# Patient Record
Sex: Female | Born: 1975 | Race: White | Hispanic: No | Smoking: Never smoker
Health system: Southern US, Community
[De-identification: ages and names within clinical notes are randomized; demographics above are authoritative.]

## PROBLEM LIST (undated history)

## (undated) DIAGNOSIS — F329 Major depressive disorder, single episode, unspecified: Secondary | ICD-10-CM

## (undated) DIAGNOSIS — F32A Depression, unspecified: Secondary | ICD-10-CM

## (undated) DIAGNOSIS — Z79890 Hormone replacement therapy (postmenopausal): Secondary | ICD-10-CM

## (undated) HISTORY — PX: CHOLECYSTECTOMY: SHX55

## (undated) HISTORY — PX: ABDOMINAL EXPLORATION SURGERY: SHX538

## (undated) HISTORY — PX: TOTAL ABDOMINAL HYSTERECTOMY W/ BILATERAL SALPINGOOPHORECTOMY: SHX83

---
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
## 2014-11-17 ENCOUNTER — Other Ambulatory Visit (HOSPITAL_COMMUNITY): Payer: Self-pay | Admitting: Otolaryngology

## 2014-11-17 DIAGNOSIS — R609 Edema, unspecified: Secondary | ICD-10-CM

## 2014-11-19 ENCOUNTER — Ambulatory Visit (HOSPITAL_COMMUNITY)
Admission: RE | Admit: 2014-11-19 | Discharge: 2014-11-19 | Disposition: A | Discharge status: Home / Self Care | Payer: 59 | Source: Ambulatory Visit | Attending: Otolaryngology | Admitting: Otolaryngology

## 2014-11-19 DIAGNOSIS — R609 Edema, unspecified: Secondary | ICD-10-CM

## 2014-11-19 DIAGNOSIS — R221 Localized swelling, mass and lump, neck: Secondary | ICD-10-CM | POA: Diagnosis not present

## 2014-11-19 MED ORDER — IOHEXOL 300 MG/ML  SOLN
75.0000 mL | Freq: Once | INTRAMUSCULAR | Status: AC | PRN
  Administered 2014-11-19: 75 mL via INTRAVENOUS

## 2019-01-16 ENCOUNTER — Emergency Department (HOSPITAL_COMMUNITY)
Admission: EM | Admit: 2019-01-16 | Discharge: 2019-01-16 | Disposition: A | Discharge status: Home / Self Care | Payer: 59 | Attending: Emergency Medicine | Admitting: Emergency Medicine

## 2019-01-16 ENCOUNTER — Emergency Department (HOSPITAL_COMMUNITY): Payer: 59

## 2019-01-16 ENCOUNTER — Other Ambulatory Visit: Payer: Self-pay

## 2019-01-16 DIAGNOSIS — N201 Calculus of ureter: Secondary | ICD-10-CM | POA: Diagnosis not present

## 2019-01-16 DIAGNOSIS — N132 Hydronephrosis with renal and ureteral calculous obstruction: Secondary | ICD-10-CM | POA: Diagnosis not present

## 2019-01-16 DIAGNOSIS — Z79899 Other long term (current) drug therapy: Secondary | ICD-10-CM | POA: Diagnosis not present

## 2019-01-16 DIAGNOSIS — R1031 Right lower quadrant pain: Secondary | ICD-10-CM | POA: Diagnosis present

## 2019-01-16 LAB — CBC WITH DIFFERENTIAL/PLATELET
Abs Immature Granulocytes: 0.02 10*3/uL (ref 0.00–0.07)
Basophils Absolute: 0.1 10*3/uL (ref 0.0–0.1)
Basophils Relative: 1 %
Eosinophils Absolute: 0.1 10*3/uL (ref 0.0–0.5)
Eosinophils Relative: 1 %
HCT: 37.1 % (ref 36.0–46.0)
Hemoglobin: 13.5 g/dL (ref 12.0–15.0)
Immature Granulocytes: 0 %
Lymphocytes Relative: 30 %
Lymphs Abs: 2.2 10*3/uL (ref 0.7–4.0)
MCH: 31.6 pg (ref 26.0–34.0)
MCHC: 36.4 g/dL — ABNORMAL HIGH (ref 30.0–36.0)
MCV: 86.9 fL (ref 80.0–100.0)
Monocytes Absolute: 0.5 10*3/uL (ref 0.1–1.0)
Monocytes Relative: 7 %
Neutro Abs: 4.4 10*3/uL (ref 1.7–7.7)
Neutrophils Relative %: 61 %
Platelets: 234 10*3/uL (ref 150–400)
RBC: 4.27 MIL/uL (ref 3.87–5.11)
RDW: 11.9 % (ref 11.5–15.5)
WBC: 7.2 10*3/uL (ref 4.0–10.5)
nRBC: 0 % (ref 0.0–0.2)

## 2019-01-16 LAB — COMPREHENSIVE METABOLIC PANEL
ALT: 19 U/L (ref 0–44)
AST: 31 U/L (ref 15–41)
Albumin: 4.6 g/dL (ref 3.5–5.0)
Alkaline Phosphatase: 47 U/L (ref 38–126)
Anion gap: 13 (ref 5–15)
BUN: 20 mg/dL (ref 6–20)
CO2: 19 mmol/L — ABNORMAL LOW (ref 22–32)
Calcium: 9.7 mg/dL (ref 8.9–10.3)
Chloride: 102 mmol/L (ref 98–111)
Creatinine, Ser: 0.91 mg/dL (ref 0.44–1.00)
GFR calc Af Amer: >60 mL/min (ref >60)
GFR calc non Af Amer: >60 mL/min (ref >60)
Glucose, Bld: 165 mg/dL — ABNORMAL HIGH (ref 70–99)
Potassium: 3.8 mmol/L (ref 3.5–5.1)
Sodium: 134 mmol/L — ABNORMAL LOW (ref 135–145)
Total Bilirubin: 1.1 mg/dL (ref 0.3–1.2)
Total Protein: 7.3 g/dL (ref 6.5–8.1)

## 2019-01-16 LAB — URINALYSIS, ROUTINE W REFLEX MICROSCOPIC
Bilirubin Urine: NEGATIVE
Glucose, UA: NEGATIVE mg/dL
Ketones, ur: NEGATIVE mg/dL
Leukocytes,Ua: NEGATIVE
Nitrite: NEGATIVE
Protein, ur: NEGATIVE mg/dL
Specific Gravity, Urine: 1.019 (ref 1.005–1.030)
pH: 5 (ref 5.0–8.0)

## 2019-01-16 LAB — LIPASE, BLOOD: Lipase: 27 U/L (ref 11–51)

## 2019-01-16 MED ORDER — HYDROMORPHONE HCL 1 MG/ML IJ SOLN
0.5000 mg | Freq: Once | INTRAMUSCULAR | Status: AC
  Administered 2019-01-16: 0.5 mg via INTRAMUSCULAR

## 2019-01-16 MED ORDER — HYDROCODONE-ACETAMINOPHEN 5-325 MG PO TABS: 2.0000 | ORAL_TABLET | ORAL | 0 refills | Status: DC | PRN

## 2019-01-16 MED ORDER — HYDROMORPHONE HCL 1 MG/ML IJ SOLN
0.5000 mg | Freq: Once | INTRAMUSCULAR | Status: DC
  Filled 2019-01-16: qty 1

## 2019-01-16 MED ORDER — KETOROLAC TROMETHAMINE 10 MG PO TABS: 10.0000 mg | ORAL_TABLET | Freq: Four times a day (QID) | ORAL | 0 refills | Status: AC | PRN

## 2019-01-16 MED ORDER — ONDANSETRON 4 MG PO TBDP
4.0000 mg | ORAL_TABLET | Freq: Once | ORAL | Status: AC
  Administered 2019-01-16: 4 mg via ORAL

## 2019-01-16 MED ORDER — ONDANSETRON 4 MG PO TBDP
ORAL_TABLET | ORAL | Status: AC
  Filled 2019-01-16: qty 1

## 2019-01-16 MED ORDER — KETOROLAC TROMETHAMINE 30 MG/ML IJ SOLN
30.0000 mg | Freq: Once | INTRAMUSCULAR | Status: AC
  Administered 2019-01-16: 30 mg via INTRAMUSCULAR
  Filled 2019-01-16: qty 1

## 2019-01-16 MED ORDER — ONDANSETRON 4 MG PO TBDP: 4.0000 mg | ORAL_TABLET | Freq: Three times a day (TID) | ORAL | 0 refills | Status: AC | PRN

## 2019-01-16 MED ORDER — ONDANSETRON HCL 4 MG/2ML IJ SOLN
4.0000 mg | Freq: Once | INTRAMUSCULAR | Status: DC
  Filled 2019-01-16: qty 2

## 2019-01-16 NOTE — ED Notes (Signed)
Attempts to gain IV access made by 2 RNs

## 2019-01-16 NOTE — Discharge Instructions (Addendum)
Take Tylenol and Toradol as needed as directed for pain. Take Zofran as needed as prescribed for nausea and vomiting. Take Norco for pain not controlled with Tylenol or Toradol.  Follow-up with urology if pain persists on Monday.  Return to ER for any new or worsening symptoms especially fever, vomiting, pain not controlled with your medications.

## 2019-01-16 NOTE — ED Triage Notes (Signed)
C/o R flank pain; saw PCP Monday for dysuria, given Cipro; increase in urinary frequency today; also endorses nausea

## 2019-01-16 NOTE — ED Provider Notes (Signed)
[facility] Provider [facility]   CSN: [id number] Arrival date & time: 01/16/19  1804     History   Chief Complaint No chief complaint on file.   HPI Carolynn CommentLisa Carroll is a 43 y.o. female.     43 year old female presents with complaint of right flank pain.  Patient states she was seen by her PCP on Monday for urinary symptoms and given Cipro for urinary tract infection.  Patient continues to have urinary frequency, worse today.  Patient has intermittent right flank pain today but then suddenly worsening right flank pain this evening associated with nausea, no vomiting.  No changes in bowel habits, no fevers or chills.  Patient has history of kidney stone previously but states it has been several years.  Prior abdominal surgeries include hysterectomy.  No other complaints or concerns.     No past medical history on file.  There are no active problems to display for this patient.    OB History   No obstetric history on file.      Home Medications    Prior to Admission medications   Medication Sig Start Date End Date Taking? Authorizing Provider  diazepam (VALIUM) 5 MG tablet Take 5 mg by mouth as needed for dizziness.   Yes [provider]  estradiol (ESTRACE) 1 MG tablet Take 1 mg by mouth daily. 10/11/17  Yes [provider]  ondansetron (ZOFRAN) 4 MG tablet Take 4 mg by mouth every 8 (eight) hours as needed for nausea/vomiting.   Yes [provider]  promethazine (PHENERGAN) 25 MG tablet Take 25 mg by mouth every 6 (six) hours as needed for nausea.   Yes [provider]  scopolamine (TRANSDERM-SCOP) 1 MG/3DAYS Place 1 patch onto the skin as needed for dizziness.   Yes [provider]  sertraline (ZOLOFT) 50 MG tablet Take 50 mg by mouth at bedtime.   Yes [provider]  HYDROcodone-acetaminophen (NORCO/VICODIN) 5-325 MG tablet Take 2 tablets by mouth every 4 (four) hours as needed. 01/16/19    Jeannie FendMurphy, Mavrick Mcquigg A, PA-C  ketorolac (TORADOL) 10 MG tablet Take 1 tablet (10 mg total) by mouth every 6 (six) hours as needed. 01/16/19   Jeannie FendMurphy, Dawaun Brancato A, PA-C  ondansetron (ZOFRAN ODT) 4 MG disintegrating tablet Take 1 tablet (4 mg total) by mouth every 8 (eight) hours as needed for nausea or vomiting. 01/16/19   Jeannie FendMurphy, Taryne Kiger A, PA-C    Family History No family history on file.  Social History Social History   Tobacco Use   Smoking status: Not on file  Substance Use Topics   Alcohol use: Not on file   Drug use: Not on file     Allergies   Patient has no known allergies.   Review of Systems Review of Systems  Constitutional: Negative for chills, diaphoresis and fever.  Respiratory: Negative for shortness of breath.   Cardiovascular: Negative for chest pain.  Gastrointestinal: Positive for nausea. Negative for abdominal pain, constipation, diarrhea and vomiting.  Genitourinary: Positive for flank pain, frequency and urgency. Negative for dysuria and hematuria.  Musculoskeletal: Positive for back pain.  Skin: Negative for rash and wound.  Allergic/Immunologic: Negative for immunocompromised state.  Neurological: Negative for dizziness and weakness.  Psychiatric/Behavioral: Negative for confusion.  All other systems reviewed and are negative.    Physical Exam Updated Vital Signs BP 135/89 (BP Location: Right Arm)    Pulse 77    Temp 98.3 F (36.8 C) (Oral)  Resp 19    Ht 5\' 2"  (1.575 m)    Wt 54.4 kg    SpO2 99%    BMI 21.95 kg/m   Physical Exam Vitals signs and nursing note reviewed.  Constitutional:      General: She is not in acute distress.    Appearance: She is well-developed. She is not diaphoretic.     Comments: Tearful, appears to be in pain  HENT:     Head: Normocephalic and atraumatic.  Cardiovascular:     Rate and Rhythm: Normal rate and regular rhythm.     Pulses: Normal pulses.     Heart sounds: Normal heart sounds.  Pulmonary:     Effort:  Pulmonary effort is normal.     Breath sounds: Normal breath sounds.  Abdominal:     Palpations: Abdomen is soft.     Tenderness: There is abdominal tenderness. There is right CVA tenderness. There is no left CVA tenderness.     Comments: Mild right side abdominal tenderness  Musculoskeletal:     Lumbar back: She exhibits no bony tenderness.       Back:  Skin:    General: Skin is warm and dry.     Findings: No erythema or rash.  Neurological:     Mental Status: She is alert and oriented to person, place, and time.  Psychiatric:        Behavior: Behavior normal.      ED Treatments / Results  Labs (all labs ordered are listed, but only abnormal results are displayed) Labs Reviewed  CBC WITH DIFFERENTIAL/PLATELET - Abnormal; Notable for the following components:      Result Value   MCHC 36.4 (*)    All other components within normal limits  COMPREHENSIVE METABOLIC PANEL - Abnormal; Notable for the following components:   Sodium 134 (*)    CO2 19 (*)    Glucose, Bld 165 (*)    All other components within normal limits  URINALYSIS, ROUTINE W REFLEX MICROSCOPIC - Abnormal; Notable for the following components:   Hgb urine dipstick SMALL (*)    All other components within normal limits  LIPASE, BLOOD    EKG None  Radiology Ct Renal Stone Study  Result Date: 01/16/2019 CLINICAL DATA:  Right flank pain. Nausea. Dysuria. EXAM: CT ABDOMEN AND PELVIS WITHOUT CONTRAST TECHNIQUE: Multidetector CT imaging of the abdomen and pelvis was performed following the standard protocol without IV contrast. COMPARISON:  None. FINDINGS: Lower chest: Lung bases are clear. Hepatobiliary: Unremarkable noncontrast appearance of the liver. Gallbladder is not well visualized may be completely decompressed or surgically absent. There is no biliary dilatation. Pancreas: No ductal dilatation or inflammation. Spleen: Normal in size without focal abnormality. Adrenals/Urinary Tract: Normal adrenal glands. 2  mm obstructing stone in the distal left ureter just proximal to the ureterovesicular junction with mild hydroureteronephrosis. Trace right perinephric edema. No additional nonobstructing stones in the right kidney. No left hydronephrosis or perinephric edema. No left renal stones. Left ureter is decompressed without stone along the course. Urinary bladder completely empty and not well evaluated. Stomach/Bowel: Stomach is within normal limits. Appendix appears normal, series 6, image 27. No evidence of bowel wall thickening, distention, or inflammatory changes. Minimal distal colonic diverticulosis without diverticulitis. Vascular/Lymphatic: Normal caliber abdominal aorta. No bulky abdominopelvic adenopathy. Reproductive: Uterus not visualized, presumably surgically absent. No adnexal mass. Other: No free air, free fluid, or intra-abdominal fluid collection. Musculoskeletal: There are no acute or suspicious osseous abnormalities. Hemi transitional lumbosacral anatomy. IMPRESSION:  1. Obstructing 2 mm stone in the distal left ureter with mild hydronephrosis. 2. Minimal distal colonic diverticulosis without diverticulitis. Electronically Signed   By: Keith Rake M.D.   On: 01/16/2019 19:13    Procedures Procedures (including critical care time)  Medications Ordered in ED Medications  ondansetron (ZOFRAN-ODT) disintegrating tablet 4 mg ( Oral Not Given 01/16/19 1914)  HYDROmorphone (DILAUDID) injection 0.5 mg (0.5 mg Intramuscular Given 01/16/19 1836)  ketorolac (TORADOL) 30 MG/ML injection 30 mg (30 mg Intramuscular Given 01/16/19 1935)     Initial Impression / Assessment and Plan / ED Course  I have reviewed the triage vital signs and the nursing notes.  Pertinent labs & imaging results that were available during my care of the patient were reviewed by me and considered in my medical decision making (see chart for details).  Clinical Course as of Jan 15 2250  Fri Jan 16, 4020  4280 43 year old  female with history of right flank pain, mild pain throughout the day today and then suddenly worsening pain.  Patient was recently seen by her PCP and diagnosed urinary tract infection started on Cipro.  Remote history of kidney stone, nothing recently.  On exam patient is very uncomfortable, rocking in the bed and tearful.  She has right low back tenderness with right CVA tenderness.  IV attempts unsuccessful, pain medications are switched to IM.  Lab work with CMP and CBC without significant findings, notable for normal renal function, urinalysis with small hemoglobin, no urinary tract infection.  Lipase within normal limits.  CT shows an obstructing 2 mm stone at the distal left ureter with mild hydronephrosis and minimal distal colonic diverticulosis without diverticulitis.  Also noted in the body of patient's CT she has trace right perinephric edema no additional obstructing stones in the right kidney.   [LM]  2250 Pain is better controlled with Toradol and patient is ready for discharge home.  She will be discharged with prescriptions for Toradol, Zofran, Norco with plans to follow-up with urology if not improving by Monday, advised to return to ER for any worsening pain vomiting fevers or any other concerning symptoms.   [LM]    Clinical Course User Index [LM] Tacy Learn, PA-C      Final Clinical Impressions(s) / ED Diagnoses   Final diagnoses:  Ureterolithiasis    ED Discharge Orders         Ordered    ondansetron (ZOFRAN ODT) 4 MG disintegrating tablet  Every 8 hours PRN     01/16/19 2117    HYDROcodone-acetaminophen (NORCO/VICODIN) 5-325 MG tablet  Every 4 hours PRN     01/16/19 2117    ketorolac (TORADOL) 10 MG tablet  Every 6 hours PRN     01/16/19 2117           Tacy Learn, PA-C 01/16/19 2251    Lucrezia Starch, MD 01/17/19 [phone]

## 2019-01-19 DIAGNOSIS — N201 Calculus of ureter: Secondary | ICD-10-CM | POA: Diagnosis not present

## 2019-04-10 ENCOUNTER — Encounter (HOSPITAL_COMMUNITY): Payer: Self-pay | Admitting: Emergency Medicine

## 2019-04-10 ENCOUNTER — Emergency Department (HOSPITAL_COMMUNITY)
Admission: EM | Admit: 2019-04-10 | Discharge: 2019-04-10 | Disposition: A | Discharge status: Home / Self Care | Payer: 59 | Attending: Emergency Medicine | Admitting: Emergency Medicine

## 2019-04-10 ENCOUNTER — Emergency Department (HOSPITAL_COMMUNITY): Payer: 59

## 2019-04-10 ENCOUNTER — Other Ambulatory Visit: Payer: Self-pay

## 2019-04-10 DIAGNOSIS — R000 Tachycardia, unspecified: Secondary | ICD-10-CM | POA: Diagnosis not present

## 2019-04-10 DIAGNOSIS — R0602 Shortness of breath: Secondary | ICD-10-CM | POA: Diagnosis not present

## 2019-04-10 DIAGNOSIS — M546 Pain in thoracic spine: Secondary | ICD-10-CM | POA: Diagnosis present

## 2019-04-10 DIAGNOSIS — R079 Chest pain, unspecified: Secondary | ICD-10-CM | POA: Diagnosis not present

## 2019-04-10 DIAGNOSIS — R091 Pleurisy: Secondary | ICD-10-CM | POA: Diagnosis not present

## 2019-04-10 DIAGNOSIS — Z79899 Other long term (current) drug therapy: Secondary | ICD-10-CM | POA: Diagnosis not present

## 2019-04-10 HISTORY — DX: Hormone replacement therapy: Z79.890

## 2019-04-10 HISTORY — DX: Depression, unspecified: F32.A

## 2019-04-10 LAB — CBC WITH DIFFERENTIAL/PLATELET
Abs Immature Granulocytes: 0.03 10*3/uL (ref 0.00–0.07)
Basophils Absolute: 0.1 10*3/uL (ref 0.0–0.1)
Basophils Relative: 1 %
Eosinophils Absolute: 0.1 10*3/uL (ref 0.0–0.5)
Eosinophils Relative: 2 %
HCT: 41.4 % (ref 36.0–46.0)
Hemoglobin: 14.3 g/dL (ref 12.0–15.0)
Immature Granulocytes: 0 %
Lymphocytes Relative: 24 %
Lymphs Abs: 1.7 10*3/uL (ref 0.7–4.0)
MCH: 31.4 pg (ref 26.0–34.0)
MCHC: 34.5 g/dL (ref 30.0–36.0)
MCV: 91 fL (ref 80.0–100.0)
Monocytes Absolute: 0.4 10*3/uL (ref 0.1–1.0)
Monocytes Relative: 6 %
Neutro Abs: 4.7 10*3/uL (ref 1.7–7.7)
Neutrophils Relative %: 67 %
Platelets: 222 10*3/uL (ref 150–400)
RBC: 4.55 MIL/uL (ref 3.87–5.11)
RDW: 11.8 % (ref 11.5–15.5)
WBC: 7 10*3/uL (ref 4.0–10.5)
nRBC: 0 % (ref 0.0–0.2)

## 2019-04-10 LAB — COMPREHENSIVE METABOLIC PANEL
ALT: 22 U/L (ref 0–44)
AST: 22 U/L (ref 15–41)
Albumin: 4.8 g/dL (ref 3.5–5.0)
Alkaline Phosphatase: 62 U/L (ref 38–126)
Anion gap: 8 (ref 5–15)
BUN: 21 mg/dL — ABNORMAL HIGH (ref 6–20)
CO2: 23 mmol/L (ref 22–32)
Calcium: 9.3 mg/dL (ref 8.9–10.3)
Chloride: 108 mmol/L (ref 98–111)
Creatinine, Ser: 0.71 mg/dL (ref 0.44–1.00)
GFR calc Af Amer: >60 mL/min (ref >60)
GFR calc non Af Amer: >60 mL/min (ref >60)
Glucose, Bld: 90 mg/dL (ref 70–99)
Potassium: 3.5 mmol/L (ref 3.5–5.1)
Sodium: 139 mmol/L (ref 135–145)
Total Bilirubin: 0.8 mg/dL (ref 0.3–1.2)
Total Protein: 8.2 g/dL — ABNORMAL HIGH (ref 6.5–8.1)

## 2019-04-10 LAB — D-DIMER, QUANTITATIVE: D-Dimer, Quant: <0.27 ug/mL-FEU (ref 0.00–0.50)

## 2019-04-10 LAB — TROPONIN I (HIGH SENSITIVITY): Troponin I (High Sensitivity): <2 ng/L (ref <18)

## 2019-04-10 MED ORDER — PREDNISONE 50 MG PO TABS
60.0000 mg | ORAL_TABLET | Freq: Once | ORAL | Status: AC
  Administered 2019-04-10: 60 mg via ORAL
  Filled 2019-04-10: qty 1

## 2019-04-10 MED ORDER — IOHEXOL 350 MG/ML SOLN
100.0000 mL | Freq: Once | INTRAVENOUS | Status: AC | PRN
  Administered 2019-04-10: 100 mL via INTRAVENOUS

## 2019-04-10 MED ORDER — BENZONATATE 100 MG PO CAPS: 200.0000 mg | ORAL_CAPSULE | Freq: Three times a day (TID) | ORAL | 0 refills | Status: AC | PRN

## 2019-04-10 MED ORDER — HYDROCODONE-ACETAMINOPHEN 5-325 MG PO TABS: 1.0000 | ORAL_TABLET | ORAL | 0 refills | Status: AC | PRN

## 2019-04-10 MED ORDER — HYDROCODONE-ACETAMINOPHEN 5-325 MG PO TABS
1.0000 | ORAL_TABLET | Freq: Once | ORAL | Status: AC
  Administered 2019-04-10: 1 via ORAL
  Filled 2019-04-10: qty 1

## 2019-04-10 MED ORDER — PREDNISONE 10 MG PO TABS: ORAL_TABLET | ORAL | 0 refills | Status: AC

## 2019-04-10 NOTE — ED Provider Notes (Signed)
[facility]ERGENC[facility] DEPART[facility] Provider Note   CSN: [id number] Arrival date & time: 04/10/19  1652     History   Chief Complaint Chief Complaint  Patient presents with  . Chest Pain    HPI Nichole Carroll is a 43 y.o. female with no significant past medical history presenting with a one week history of slowly worsening right upper back pain which now radiates into her right anterior upper chest and is worsened with deep inspiration.  Her pain is not triggered by movement, twisting or stretching.  She has had no fevers or chills. She does endorse occasional non productive cough but denies sob and has had no fevers or chills, also no n/v, abdominal pain.  She does endorse fatigue this week, has had no palpitations, dizziness, also no extremity pain or swelling.  She is on hormone replacement therapy, no other risk factors for dvt/PE.  She works in [employer], broadcasting/film/videonursing and has has potential exposure to [employer].      The history is provided by the patient.    Past Medical History:  Diagnosis Date  . Depression   . Hormone replacement therapy     There are no active problems to display for this patient.   Past Surgical History:  Procedure Laterality Date  . ABDOMINAL EXPLORATION SURGERY    . CESAREAN SECTION    . CHOLECYSTECTOMY    . TOTAL ABDOMINAL HYSTERECTOMY W/ BILATERAL SALPINGOOPHORECTOMY       OB History    Gravida      Para      Term      Preterm      AB      Living  2     SAB      TAB      Ectopic      Multiple      Live Births               Home Medications    Prior to Admission medications   Medication Sig Start Date End Date Taking? Authorizing Provider  diazepam (VALIUM) 5 MG tablet Take 5 mg by mouth as needed for dizziness.   Yes [provider]  estradiol (ESTRACE) 1 MG tablet Take 1 mg by mouth daily. 10/11/17  Yes [provider]  ondansetron (ZOFRAN ODT) 4 MG disintegrating tablet Take 1 tablet (4 mg total) by mouth every 8  (eight) hours as needed for nausea or vomiting. 01/16/19  Yes Jeannie FendMurphy, Laura A, PA-C  scopolamine (TRANSDERM-SCOP) 1 MG/3DAYS Place 1 patch onto the skin as needed for dizziness.   Yes [provider]  sertraline (ZOLOFT) 50 MG tablet Take 50 mg by mouth at bedtime.   Yes [provider]  benzonatate (TESSALON) 100 MG capsule Take 2 capsules (200 mg total) by mouth 3 (three) times daily as needed. 04/10/19   Burgess AmorIdol, Rya Rausch, PA-C  HYDROcodone-acetaminophen (NORCO/VICODIN) 5-325 MG tablet Take 1 tablet by mouth every 4 (four) hours as needed. 04/10/19   Burgess AmorIdol, Winnie Umali, PA-C  ketorolac (TORADOL) 10 MG tablet Take 1 tablet (10 mg total) by mouth every 6 (six) hours as needed. 01/16/19   Jeannie FendMurphy, Laura A, PA-C  predniSONE (DELTASONE) 10 MG tablet Take 6 tablets day one, 5 tablets day two, 4 tablets day three, 3 tablets day four, 2 tablets day five, then 1 tablet day six 04/10/19   Braylie Badami, Raynelle FanningJulie, PA-C  promethazine (PHENERGAN) 25 MG tablet Take 25 mg by mouth every 6 (six) hours as needed for nausea.  [provider]    Family History History reviewed. No pertinent family history.  Social History Social History   Tobacco Use  . Smoking status: Never Smoker  . Smokeless tobacco: Never Used  Substance Use Topics  . Alcohol use: Never    Frequency: Never  . Drug use: Never     Allergies   Patient has no known allergies.   Review of Systems Review of Systems  Constitutional: Positive for fatigue. Negative for chills and fever.  HENT: Negative for congestion and sore throat.   Eyes: Negative.   Respiratory: Negative for chest tightness, shortness of breath and wheezing.   Cardiovascular: Positive for chest pain. Negative for palpitations and leg swelling.  Gastrointestinal: Negative for abdominal pain, nausea and vomiting.  Genitourinary: Negative.   Musculoskeletal: Positive for back pain. Negative for arthralgias, joint swelling, myalgias and neck pain.  Skin:  Negative.  Negative for rash and wound.  Neurological: Negative for dizziness, weakness, light-headedness, numbness and headaches.  Psychiatric/Behavioral: Negative.      Physical Exam Updated Vital Signs BP 112/76   Pulse 76   Temp 98.4 F (36.9 C) (Oral)   Resp 18   Ht  (1.6 m)   Wt 54.4 kg   SpO2 100%   BMI 21.26 kg/m   Physical Exam Vitals signs and nursing note reviewed.  Constitutional:      Appearance: She is well-developed.  HENT:     Head: Normocephalic and atraumatic.  Eyes:     Conjunctiva/sclera: Conjunctivae normal.  Neck:     Musculoskeletal: Normal range of motion.  Cardiovascular:     Rate and Rhythm: Normal rate and regular rhythm.     Heart sounds: Normal heart sounds.  Pulmonary:     Effort: Pulmonary effort is normal. No tachypnea or accessory muscle usage.     Breath sounds: Normal breath sounds. No stridor or decreased air movement. No wheezing, rhonchi or rales.     Comments: Chest or back pain not reproducible with palpation. Abdominal:     General: Bowel sounds are normal.     Palpations: Abdomen is soft.     Tenderness: There is no abdominal tenderness.  Musculoskeletal: Normal range of motion.        General: No tenderness.     Right lower leg: No edema.     Left lower leg: No edema.  Skin:    General: Skin is warm and dry.  Neurological:     Mental Status: She is alert and oriented to person, place, and time.      ED Treatments / Results  Labs (all labs ordered are listed, but only abnormal results are displayed) Labs Reviewed  COMPREHENSIVE METABOLIC PANEL - Abnormal; Notable for the following components:      Result Value   BUN 21 (*)    Total Protein 8.2 (*)    All other components within normal limits  CBC WITH DIFFERENTIAL/PLATELET  D-DIMER, QUANTITATIVE (NOT AT [facility])  TROPONIN I (HIGH SENSITIVITY)    EKG ED ECG REPORT   Date: 04/11/2019  Rate: 106  Rhythm: sinus tachycardia  QRS Axis: normal  Intervals:  normal  ST/T Wave abnormalities: normal  Conduction Disutrbances:none  Narrative Interpretation:   Old EKG Reviewed: none available  I have personally reviewed the EKG tracing and agree with the computerized printout as noted.   Radiology Dg Chest 2 View  Result Date: 04/10/2019 CLINICAL DATA:  Chest pain shortness of breath EXAM: CHEST - 2 VIEW COMPARISON:  None. FINDINGS: The heart size and mediastinal contours are within normal limits. Both lungs are clear. The visualized skeletal structures are unremarkable. IMPRESSION: No acute cardiopulmonary disease. Electronically Signed   By: Donzetta Kohut M.D.   On: 04/10/2019 19:09   Ct Angio Chest Pe W And/or Wo Contrast  Result Date: 04/10/2019 CLINICAL DATA:  Right-sided chest pain radiating into her back. EXAM: CT ANGIOGRAPHY CHEST WITH CONTRAST TECHNIQUE: Multidetector CT imaging of the chest was performed using the standard protocol during bolus administration of intravenous contrast. Multiplanar CT image reconstructions and MIPs were obtained to evaluate the vascular anatomy. CONTRAST:  OMNIPAQUE IOHEXOL 350 MG/ML SOLN COMPARISON:  None. FINDINGS: Cardiovascular: Contrast injection is sufficient to demonstrate satisfactory opacification of the pulmonary arteries to the segmental level. There is no pulmonary embolus. The main pulmonary artery is within normal limits for size. There is no CT evidence of acute right heart strain. The visualized aorta is normal. Heart size is normal, without pericardial effusion. Mediastinum/Nodes: --No mediastinal or hilar lymphadenopathy. --No axillary lymphadenopathy. --No supraclavicular lymphadenopathy. --Normal thyroid gland. --The esophagus is unremarkable Lungs/Pleura: There is a small 2 mm pulmonary nodule in the peripheral right lower lobe (axial series 6, image 92). There is no pneumothorax or pleural effusion. No large focal infiltrate. Bruise scarring within the peripheral left upper lobe. The  trachea is unremarkable. Upper Abdomen: No acute abnormality. Musculoskeletal: No chest wall abnormality. No acute or significant osseous findings. Review of the MIP images confirms the above findings. IMPRESSION: 1. No evidence of pulmonary embolism or other acute intrathoracic process. 2. A 2 mm right lower lobe pulmonary nodule. In a low risk patient, no further follow-up is needed. In a high-risk patient, a 12 month follow-up CT is recommended. Electronically Signed   By: Katherine Mantle M.D.   On: 04/10/2019 22:19    Procedures Procedures (including critical care time)  Medications Ordered in ED Medications  predniSONE (DELTASONE) tablet 60 mg (60 mg Oral Given 04/10/19 1957)  HYDROcodone-acetaminophen (NORCO/VICODIN) 5-325 MG per tablet 1 tablet (1 tablet Oral Given 04/10/19 2102)  iohexol (OMNIPAQUE) 350 MG/ML injection 100 mL (100 mLs Intravenous Contrast Given 04/10/19 2202)     Initial Impression / Assessment and Plan / ED Course  I have reviewed the triage vital signs and the nursing notes.  Pertinent labs & imaging results that were available during my care of the patient were reviewed by me and considered in my medical decision making (see chart for details).        Labs and imaging reviewed and discussed with pt. Pt has pleuritic CP, initially was tachycardic on arrival.  Hormone replacement so unable to [facility] pt.  CT imaging performed and negative for acute PE.  She was placed on prednisone for suspected pleurisy. Discussed other home tx.  Also advised pt of small nodule that will require f/u imaging in one year (pt endorses "occasional" smoker). Plan f/u with pcp.  Final Clinical Impressions(s) / ED Diagnoses   Final diagnoses:  Pleurisy    ED Discharge Orders         Ordered    benzonatate (TESSALON) 100 MG capsule  3 times daily PRN     04/10/19 2254    HYDROcodone-acetaminophen (NORCO/VICODIN) 5-325 MG tablet  Every 4 hours PRN     04/10/19 2254    predniSONE  (DELTASONE) 10 MG tablet     04/10/19 2254           Burgess Amor, PA-C 04/11/19 2052  Milton Ferguson, MD 04/11/19 2258

## 2019-04-10 NOTE — Discharge Instructions (Addendum)
Your lab tests, EKG and imaging tonight is reassuring.  You do not have a pulmonary embolism.  I suspect your symptoms are from pleurisy.  The treatment for this is anti-inflammatories.  You were given a dose of prednisone here and I have prescribed you a prednisone taper, take the next dose tomorrow evening.  Do not drive within 4 hours of  taking hydrocodone, this medication was prescribed if you need extra pain relief.  As we discussed you do have a small 2 mm nodule in your right lower lung field.  It is recommended that you have a repeat CT scan in 1 year follow this up and make sure it is unchanged.  Your primary doctor can arrange this for you.  Get rechecked if your symptoms are worsening in any way.

## 2019-04-10 NOTE — ED Provider Notes (Signed)
[facility] EMERGENCY DEPARTMENT Provider Note   CSN: [id number] Arrival date & time: 04/10/19  1652     History   Chief Complaint Chief Complaint  Patient presents with  . Chest Pain    HPI Nichole Carroll is a 43 y.o. female.     HPI  Past Medical History:  Diagnosis Date  . Depression   . Hormone replacement therapy     There are no active problems to display for this patient.   Past Surgical History:  Procedure Laterality Date  . ABDOMINAL EXPLORATION SURGERY    . CESAREAN SECTION    . CHOLECYSTECTOMY    . TOTAL ABDOMINAL HYSTERECTOMY W/ BILATERAL SALPINGOOPHORECTOMY       OB History    Gravida      Para      Term      Preterm      AB      Living  2     SAB      TAB      Ectopic      Multiple      Live Births               Home Medications    Prior to Admission medications   Medication Sig Start Date End Date Taking? Authorizing Provider  diazepam (VALIUM) 5 MG tablet Take 5 mg by mouth as needed for dizziness.   Yes [provider]  estradiol (ESTRACE) 1 MG tablet Take 1 mg by mouth daily. 10/11/17  Yes [provider]  ondansetron (ZOFRAN ODT) 4 MG disintegrating tablet Take 1 tablet (4 mg total) by mouth every 8 (eight) hours as needed for nausea or vomiting. 01/16/19  Yes Tacy Learn, PA-C  scopolamine (TRANSDERM-SCOP) 1 MG/3DAYS Place 1 patch onto the skin as needed for dizziness.   Yes [provider]  sertraline (ZOLOFT) 50 MG tablet Take 50 mg by mouth at bedtime.   Yes [provider]  HYDROcodone-acetaminophen (NORCO/VICODIN) 5-325 MG tablet Take 2 tablets by mouth every 4 (four) hours as needed. 01/16/19   Tacy Learn, PA-C  ketorolac (TORADOL) 10 MG tablet Take 1 tablet (10 mg total) by mouth every 6 (six) hours as needed. 01/16/19   Tacy Learn, PA-C  promethazine (PHENERGAN) 25 MG tablet Take 25 mg by mouth every 6 (six) hours as needed for nausea.    [provider]     Family History History reviewed. No pertinent family history.  Social History Social History   Tobacco Use  . Smoking status: Never Smoker  . Smokeless tobacco: Never Used  Substance Use Topics  . Alcohol use: Never    Frequency: Never  . Drug use: Never     Allergies   Patient has no known allergies.   Review of Systems Review of Systems   Physical Exam Updated Vital Signs BP 108/79   Pulse 77   Temp 98.4 F (36.9 C) (Oral)   Resp (!) 27   Ht 5\' 3"  (1.6 m)   Wt 54.4 kg   SpO2 100%   BMI 21.26 kg/m   Physical Exam   ED Treatments / Results  Labs (all labs ordered are listed, but only abnormal results are displayed) Labs Reviewed  COMPREHENSIVE METABOLIC PANEL - Abnormal; Notable for the following components:      Result Value   BUN 21 (*)    Total Protein 8.2 (*)    All other components within normal limits  CBC WITH DIFFERENTIAL/PLATELET  D-DIMER, QUANTITATIVE (NOT AT [facility])  TROPONIN I (HIGH SENSITIVITY)    EKG None  Radiology Dg Chest 2 View  Result Date: 04/10/2019 CLINICAL DATA:  Chest pain shortness of breath EXAM: CHEST - 2 VIEW COMPARISON:  None. FINDINGS: The heart size and mediastinal contours are within normal limits. Both lungs are clear. The visualized skeletal structures are unremarkable. IMPRESSION: No acute cardiopulmonary disease. Electronically Signed   By: Donzetta Kohut M.D.   On: 04/10/2019 19:09    Procedures Procedures (including critical care time)  Medications Ordered in ED Medications  predniSONE (DELTASONE) tablet 60 mg (60 mg Oral Given 04/10/19 1957)     Initial Impression / Assessment and Plan / ED Course  I have reviewed the triage vital signs and the nursing notes.  Pertinent labs & imaging results that were available during my care of the patient were reviewed by me and considered in my medical decision making (see chart for details).          Final Clinical Impressions(s) / ED Diagnoses   Final  diagnoses:  None    ED Discharge Orders    None       Bethann Berkshire, MD 04/12/19 1142

## 2019-04-10 NOTE — ED Notes (Signed)
Patient transported to CT 

## 2019-04-10 NOTE — ED Triage Notes (Signed)
PT c/o right sided chest pain radiating into her back worsening with a deep breath in x1 week.

## 2019-06-03 DIAGNOSIS — F1921 Other psychoactive substance dependence, in remission: Secondary | ICD-10-CM | POA: Diagnosis not present

## 2019-06-03 DIAGNOSIS — T753XXA Motion sickness, initial encounter: Secondary | ICD-10-CM | POA: Diagnosis not present

## 2019-06-03 DIAGNOSIS — F419 Anxiety disorder, unspecified: Secondary | ICD-10-CM | POA: Diagnosis not present

## 2020-11-11 IMAGING — CT CT RENAL STONE PROTOCOL
2 of 4 series · 16 of 46 positions shown, 18 images · non-contrast
Comparison: None.

CLINICAL DATA: Right flank pain. Nausea. Dysuria.

EXAM:
CT ABDOMEN AND PELVIS WITHOUT CONTRAST
TECHNIQUE: Multidetector CT imaging of the abdomen and pelvis was performed
following the standard protocol without IV contrast.

[Series 3: ap without · axial · non-contrast · 0.71mm/px · z∈[-534,-144]mm · 13 of 88 slices shown, 15 images]
[im 5/88  soft-tissue]
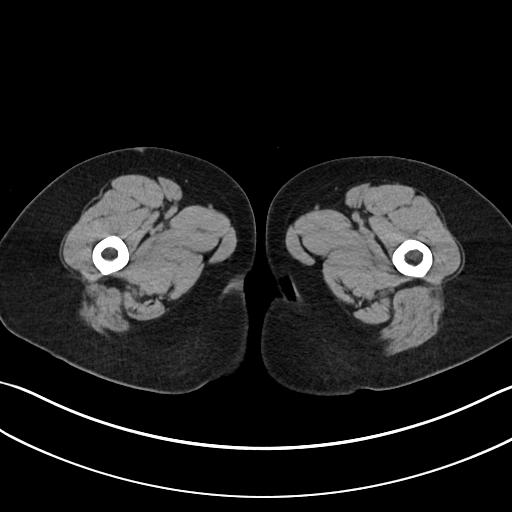
[im 5/88  bone]
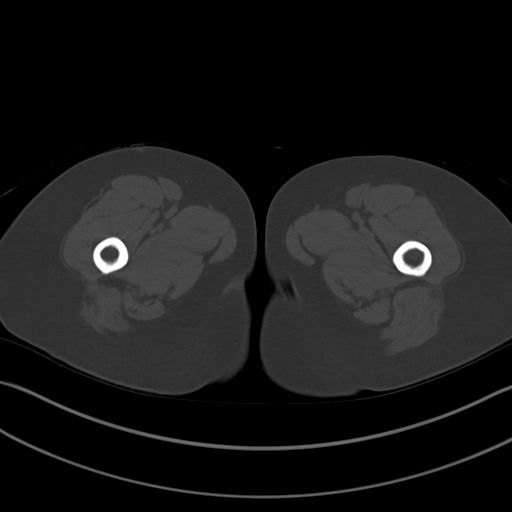
[im 14/88  soft-tissue]
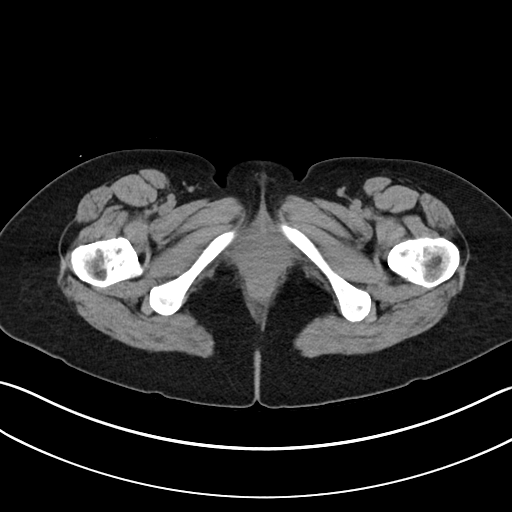
[im 19/88  soft-tissue]
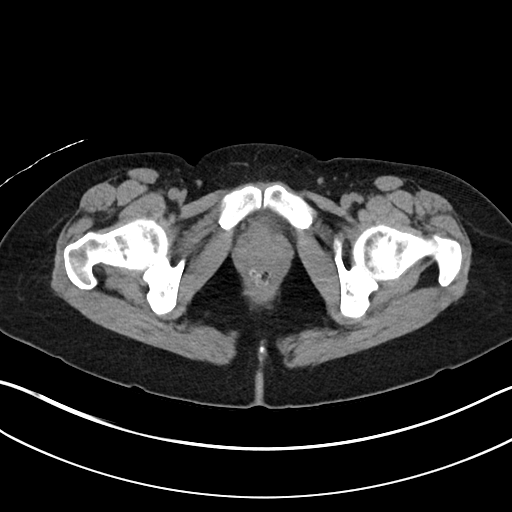
[im 23/88  soft-tissue]
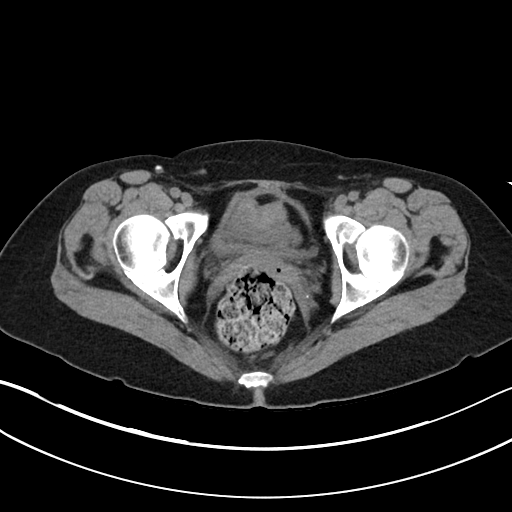
[im 33/88  soft-tissue]
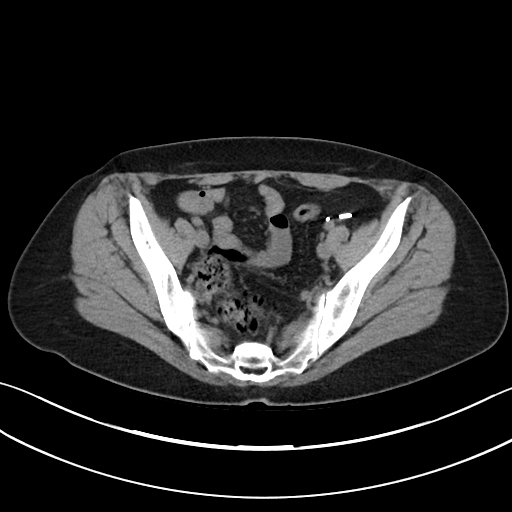
[im 37/88  soft-tissue]
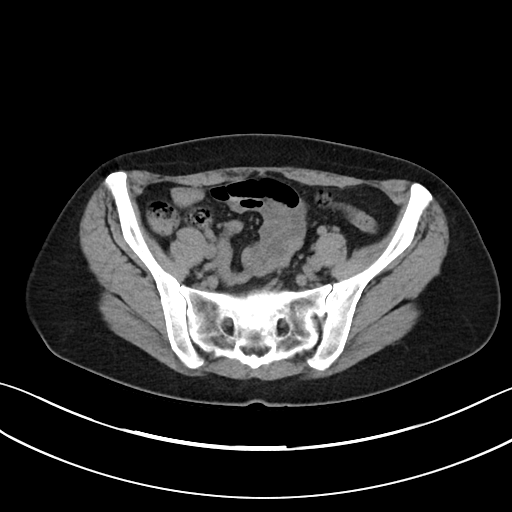
[im 46/88  soft-tissue]
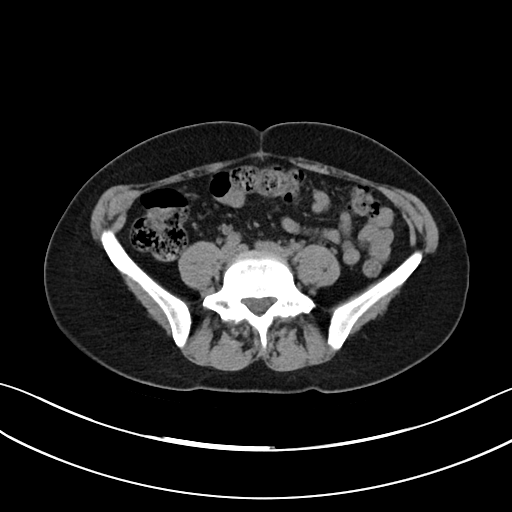
[im 51/88  soft-tissue]
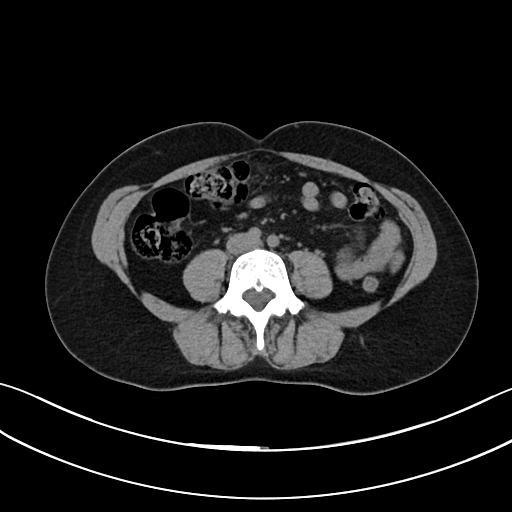
[im 55/88  soft-tissue]
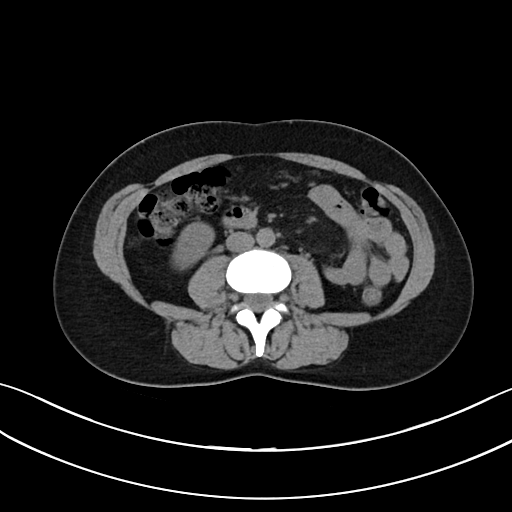
[im 55/88  bone]
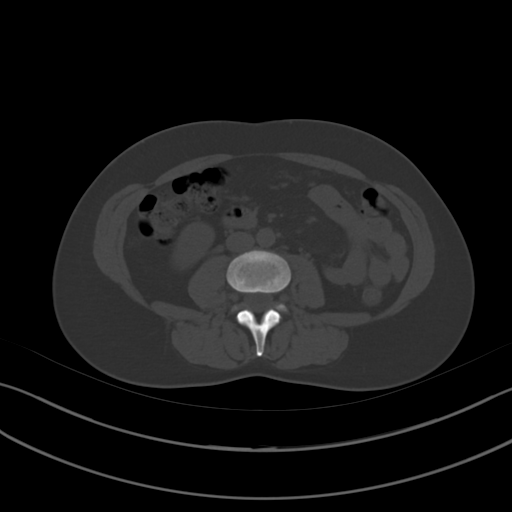
[im 65/88  soft-tissue]
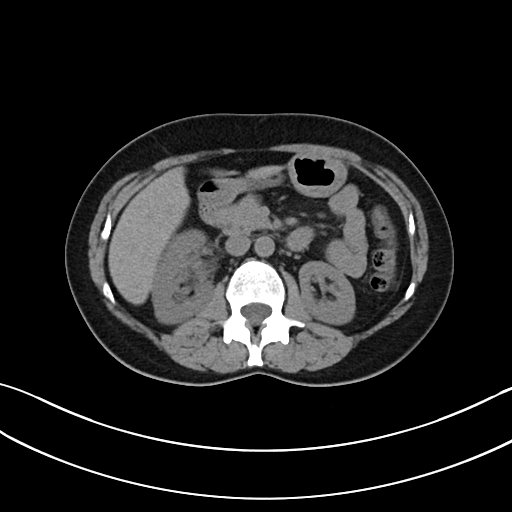
[im 69/88  soft-tissue]
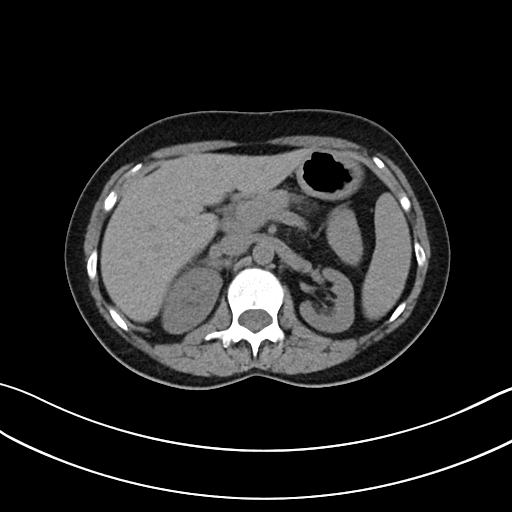
[im 74/88  soft-tissue]
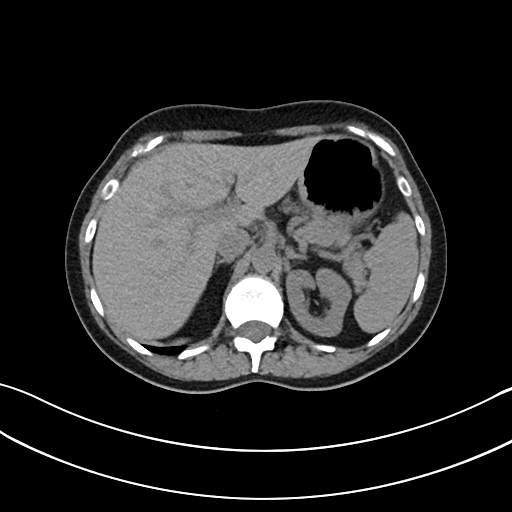
[im 83/88  soft-tissue]
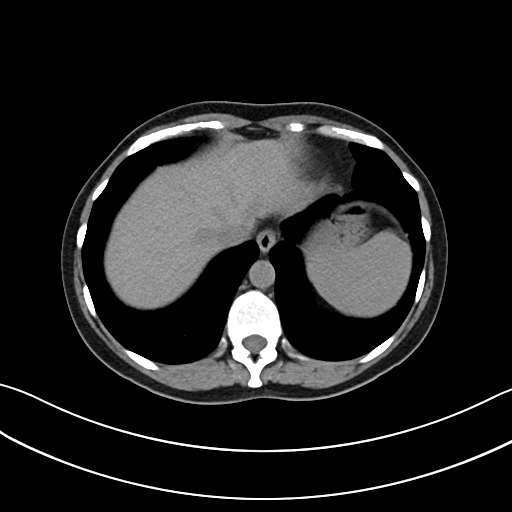

[Series 6: cor · coronal · 0.68mm/px · 3 of 76 slices shown]
[im 26/76  soft-tissue]
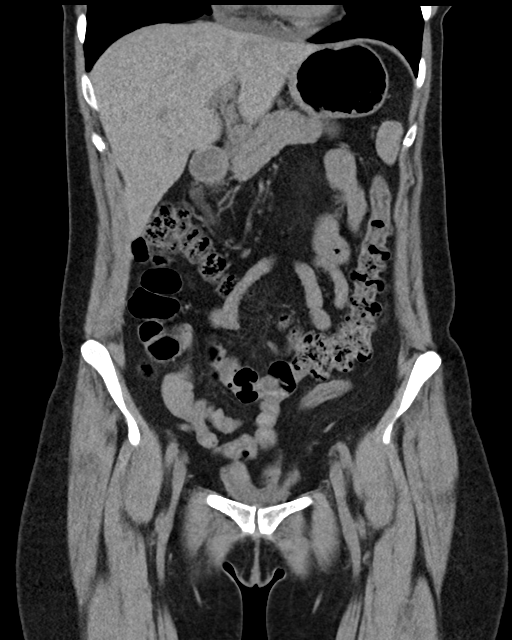
[im 34/76  soft-tissue]
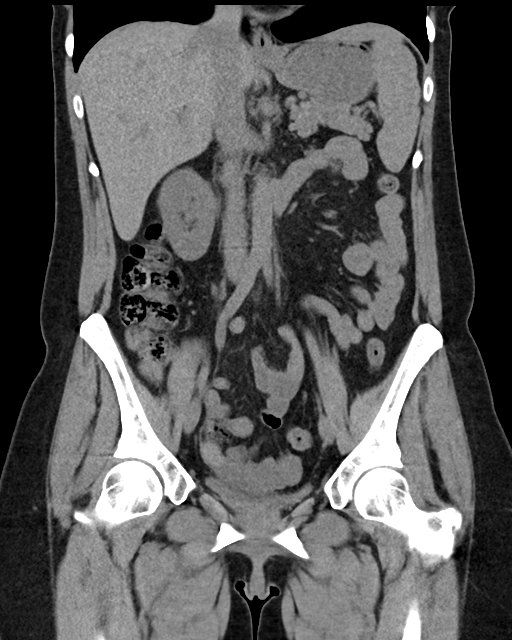
[im 42/76  soft-tissue]
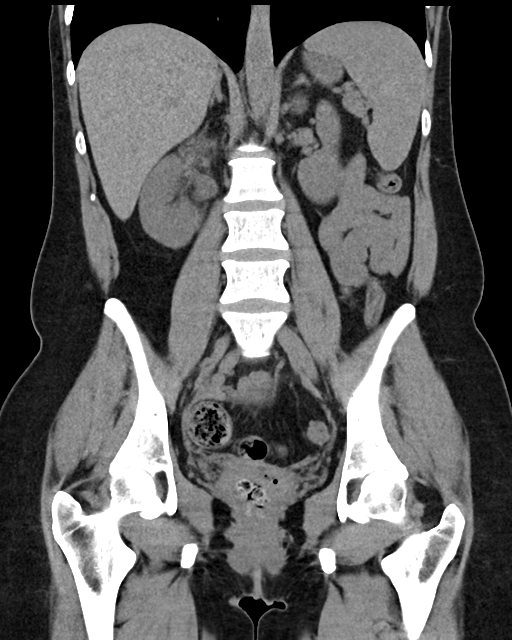

[16 of 46 positions shown; findings below may reference images not displayed]

FINDINGS: Lower chest: Lung bases are clear.

Hepatobiliary: Unremarkable noncontrast appearance of the liver.
Gallbladder is not well visualized may be completely decompressed or
surgically absent. There is no biliary dilatation.

Pancreas: No ductal dilatation or inflammation.

Spleen: Normal in size without focal abnormality.

Adrenals/Urinary Tract: Normal adrenal glands.

2 mm obstructing stone in the distal left ureter just proximal to
the ureterovesicular junction with mild hydroureteronephrosis. Trace
right perinephric edema. No additional nonobstructing stones in the
right kidney.

No left hydronephrosis or perinephric edema. No left renal stones.
Left ureter is decompressed without stone along the course.

Urinary bladder completely empty and not well evaluated.

Stomach/Bowel: Stomach is within normal limits. Appendix appears
normal, series 6, image 27. No evidence of bowel wall thickening,
distention, or inflammatory changes. Minimal distal colonic
diverticulosis without diverticulitis.

Vascular/Lymphatic: Normal caliber abdominal aorta. No bulky
abdominopelvic adenopathy.

Reproductive: Uterus not visualized, presumably surgically absent.
No adnexal mass.

Other: No free air, free fluid, or intra-abdominal fluid collection.

Musculoskeletal: There are no acute or suspicious osseous
abnormalities. Hemi transitional lumbosacral anatomy.
IMPRESSION: 1. Obstructing 2 mm stone in the distal left ureter with mild
hydronephrosis.
2. Minimal distal colonic diverticulosis without diverticulitis.

## 2021-02-03 IMAGING — DX DG CHEST 2V
2 series · 2 of 2 positions shown · non-contrast
Comparison: None.

CLINICAL DATA: Chest pain shortness of breath

EXAM:
CHEST - 2 VIEW

[chest pa]
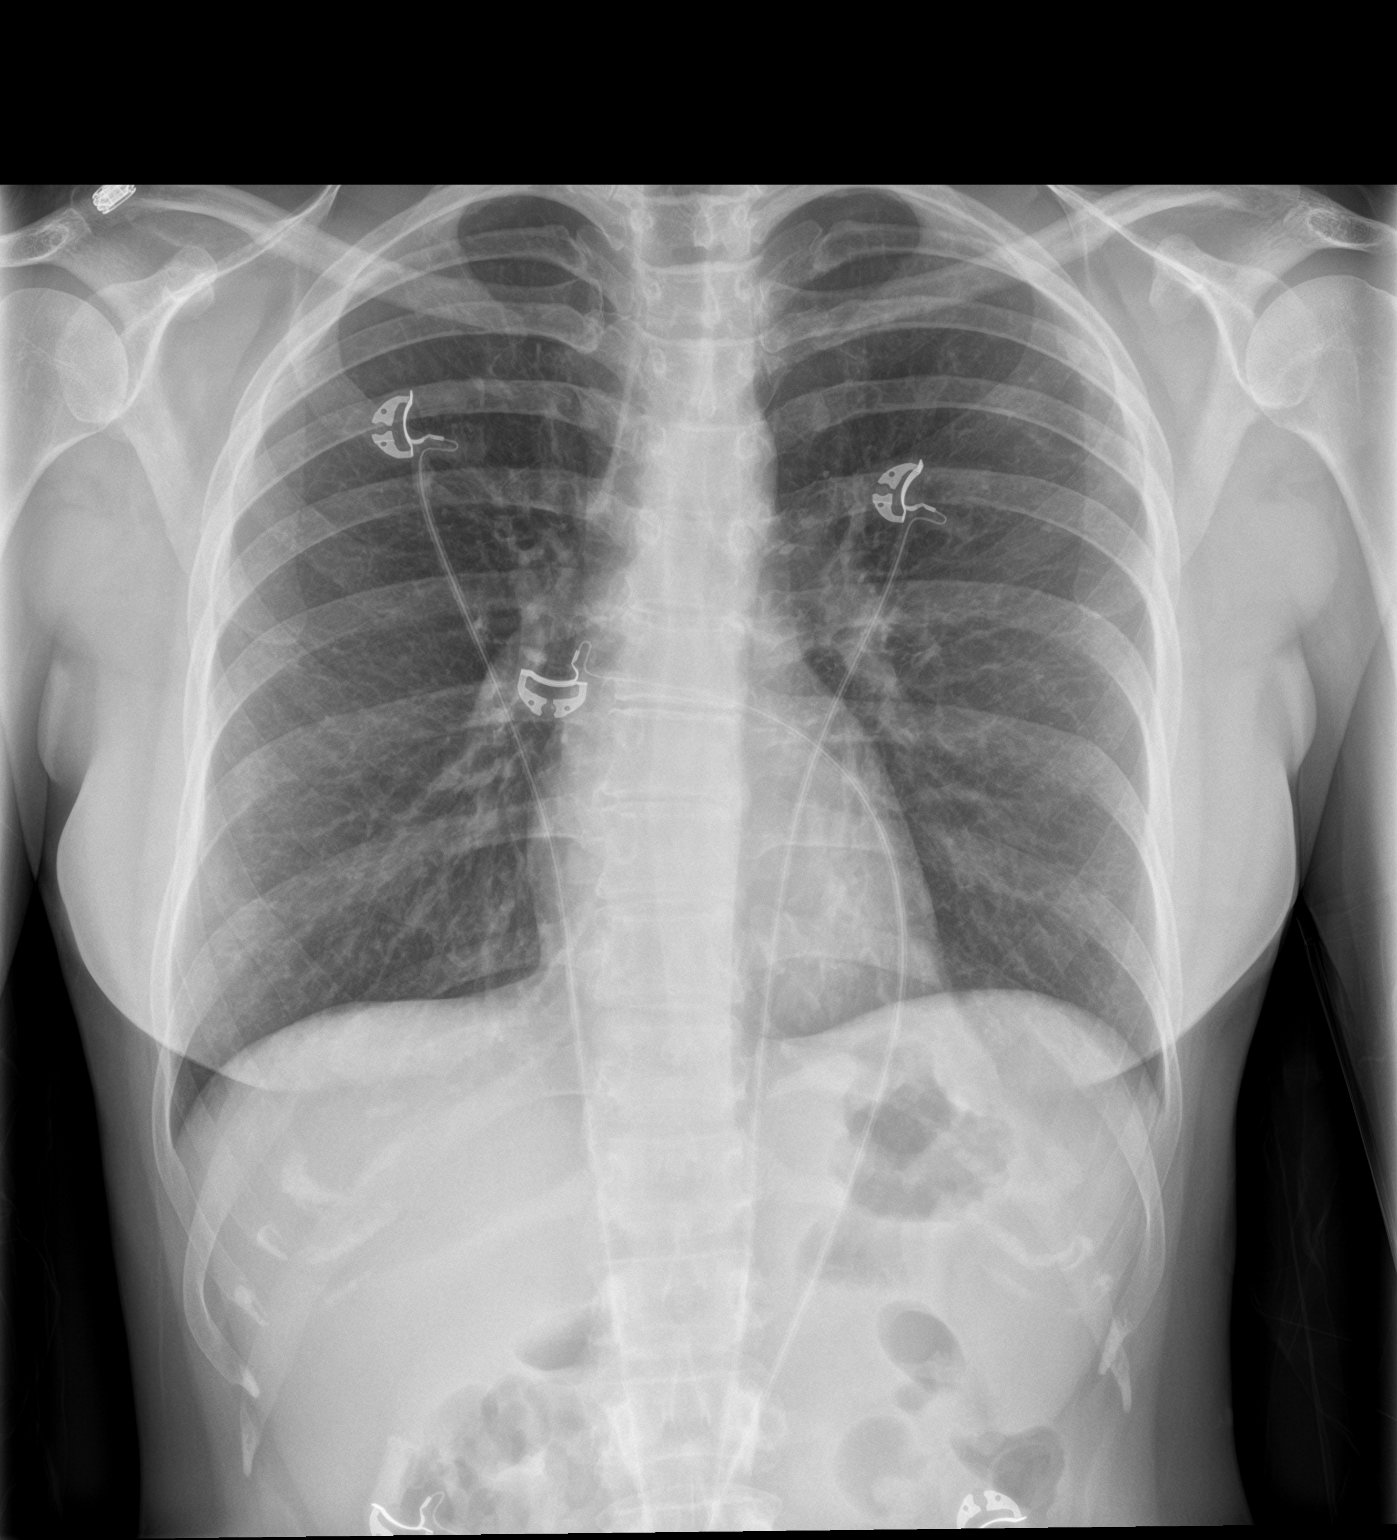

[chest lat]
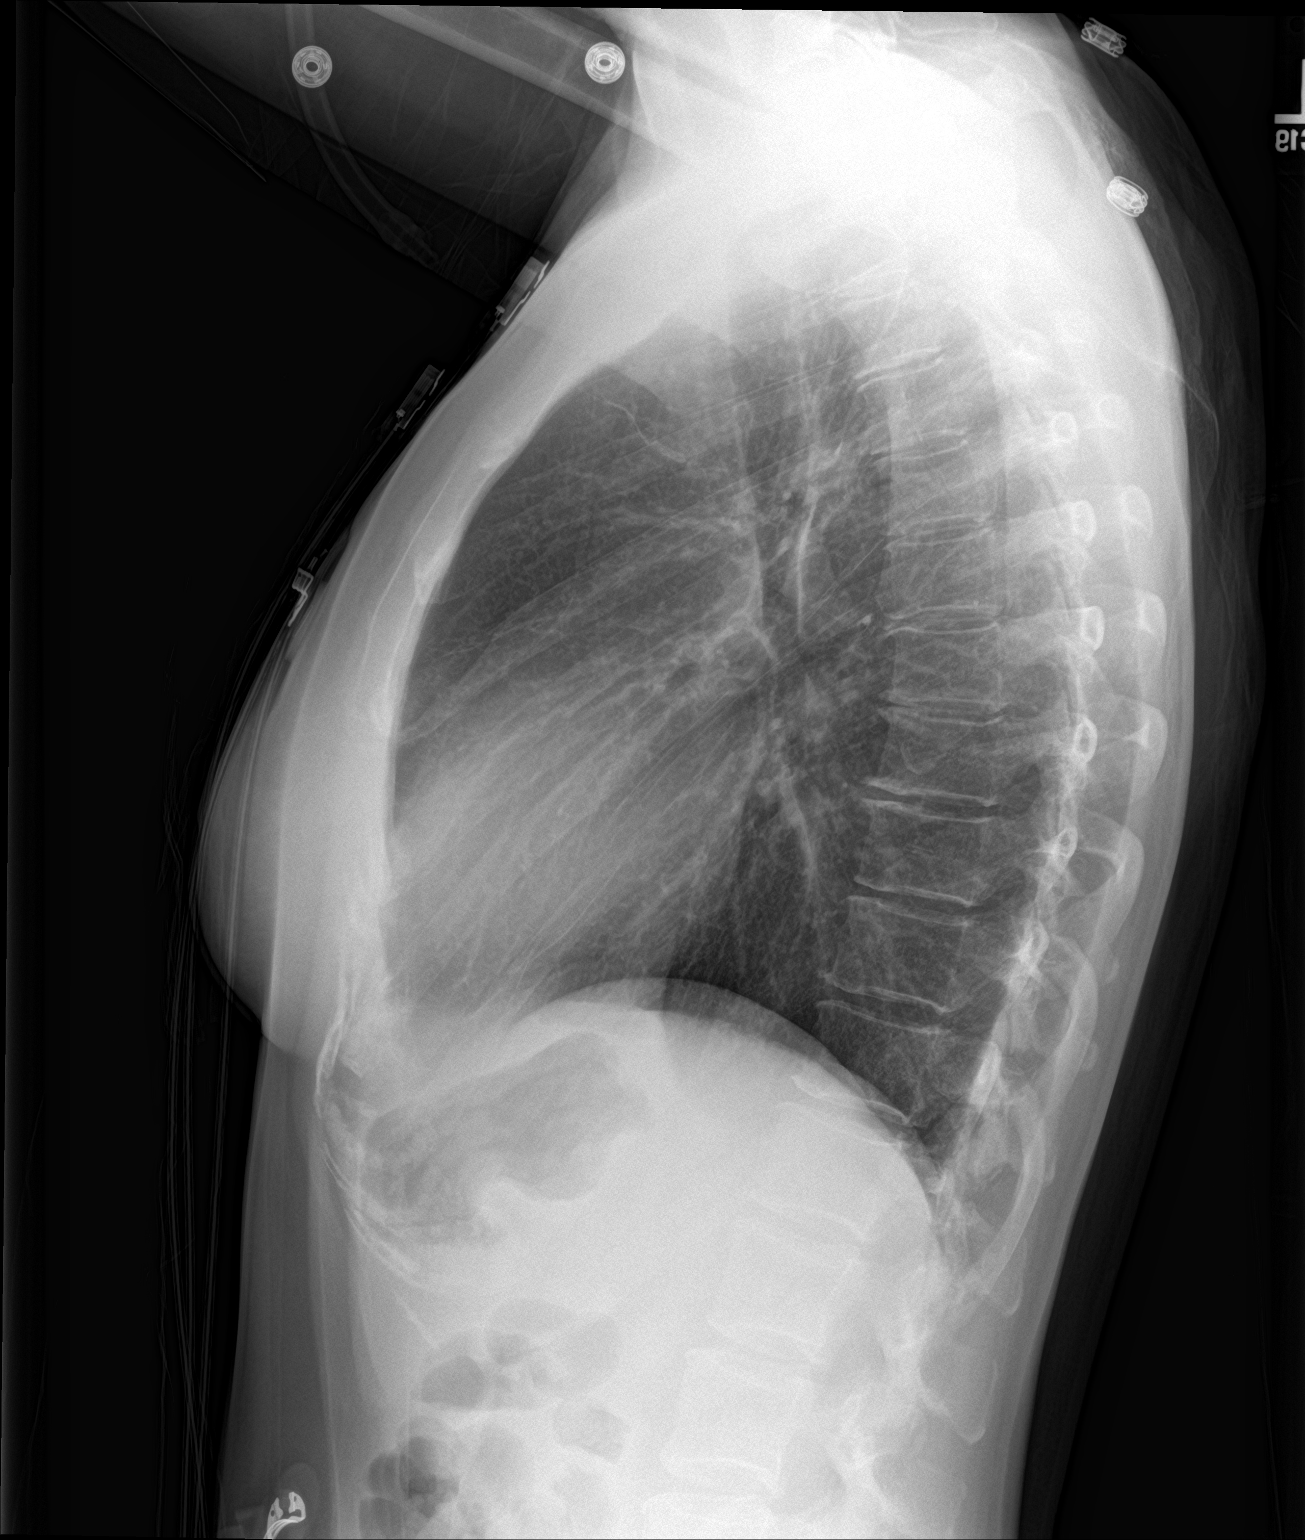

[2 of 2 positions shown; findings below may reference images not displayed]

FINDINGS: The heart size and mediastinal contours are within normal limits.
Both lungs are clear. The visualized skeletal structures are
unremarkable.
IMPRESSION: No acute cardiopulmonary disease.

## 2021-02-03 IMAGING — CT CT ANGIO CHEST
2 of 6 series · 19 of 46 positions shown · IV contrast (omnipaque)
Comparison: None.

CLINICAL DATA: Right-sided chest pain radiating into her back.

EXAM:
CT ANGIOGRAPHY CHEST WITH CONTRAST
TECHNIQUE: Multidetector CT imaging of the chest was performed using the
standard protocol during bolus administration of intravenous
contrast. Multiplanar CT image reconstructions and MIPs were
obtained to evaluate the vascular anatomy.
CONTRAST:  100mL OMNIPAQUE IOHEXOL 350 MG/ML SOLN

[Series 5: pe axial thins · axial · 0.60mm/px · z∈[-376,-107]mm · 16 of 295 slices shown]
[im 13/295  lung]
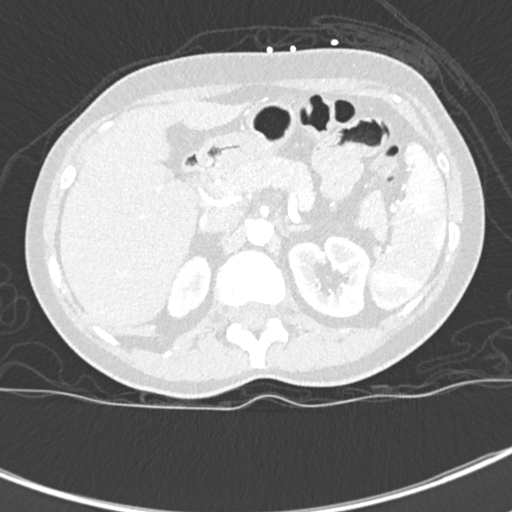
[im 39/295  soft-tissue]
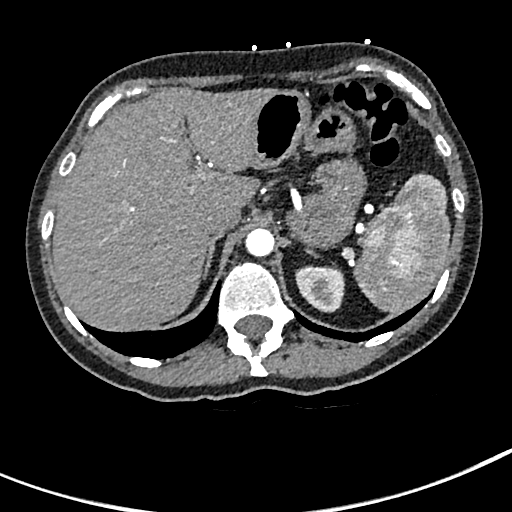
[im 52/295  lung]
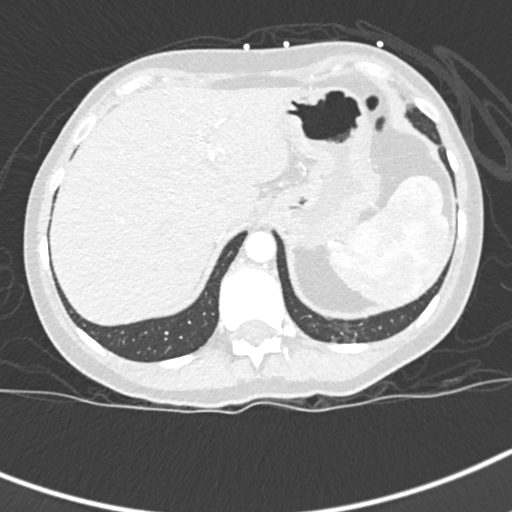
[im 64/295  soft-tissue]
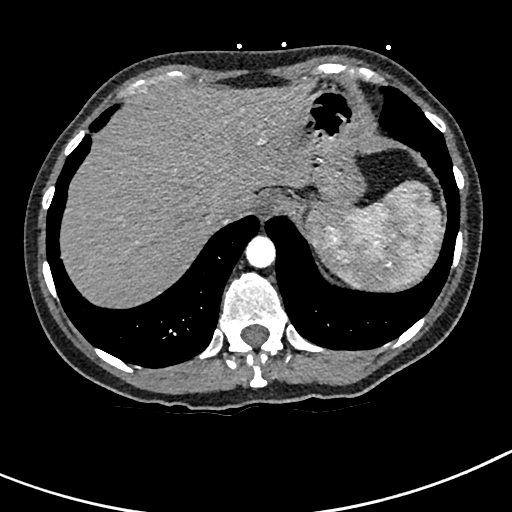
[im 90/295  lung]
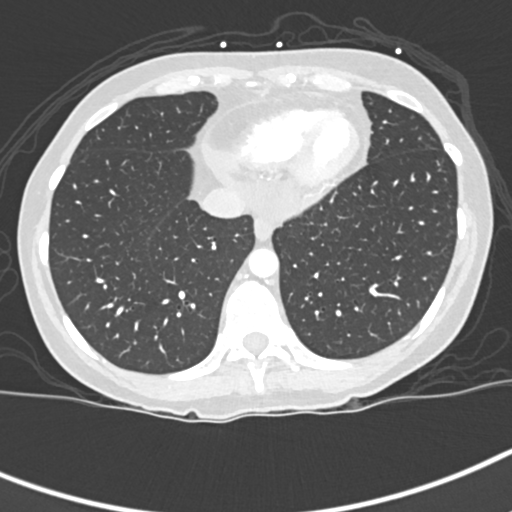
[im 103/295  soft-tissue]
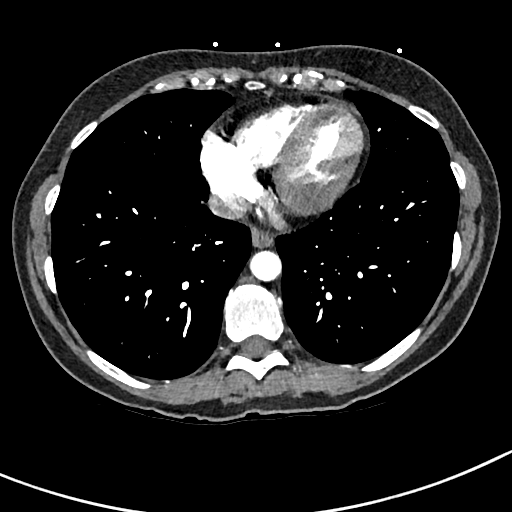
[im 116/295  lung]
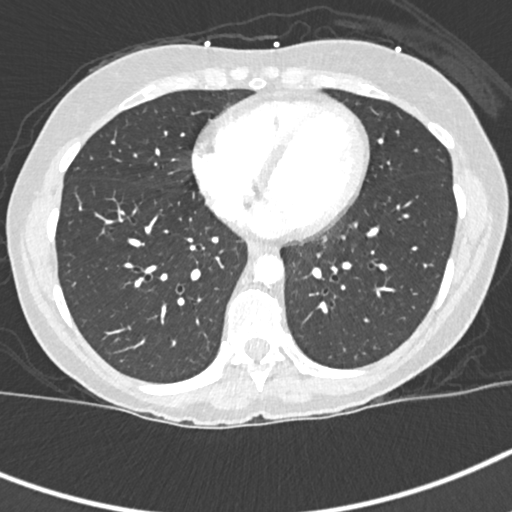
[im 141/295  soft-tissue]
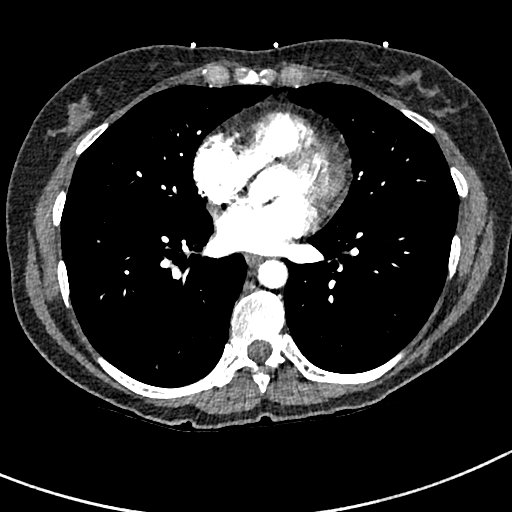
[im 154/295  lung]
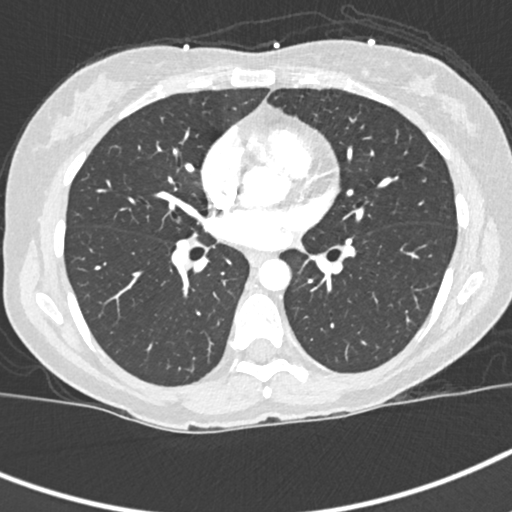
[im 179/295  soft-tissue]
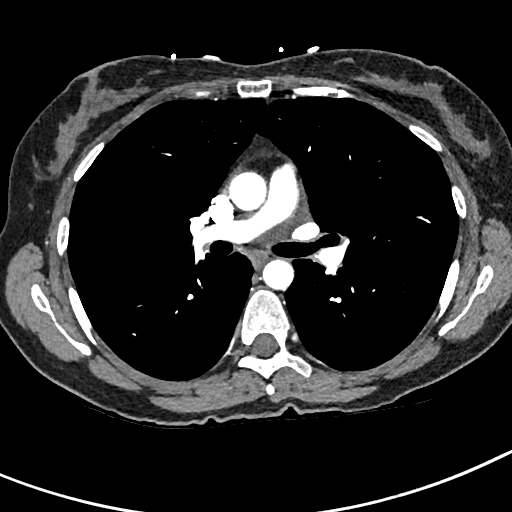
[im 192/295  lung]
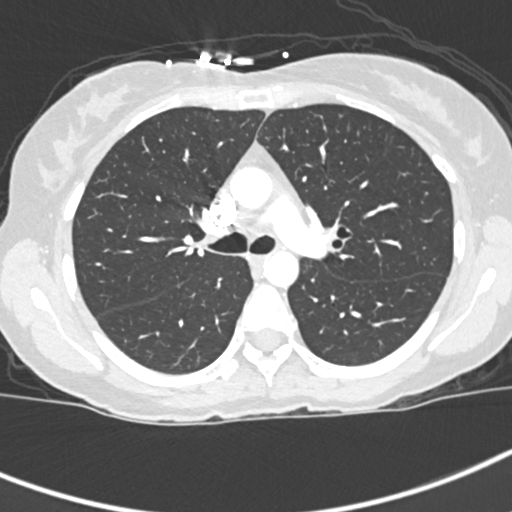
[im 205/295  soft-tissue]
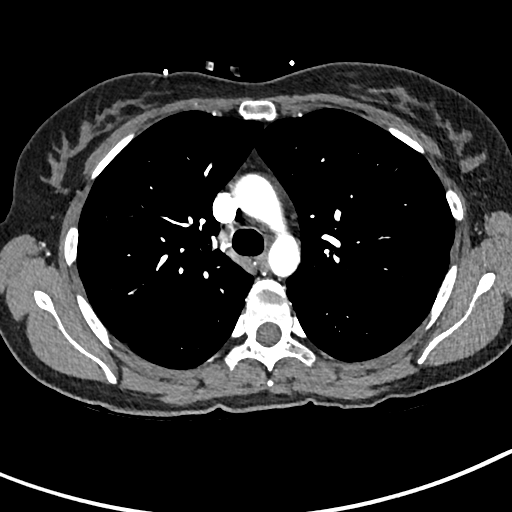
[im 231/295  lung]
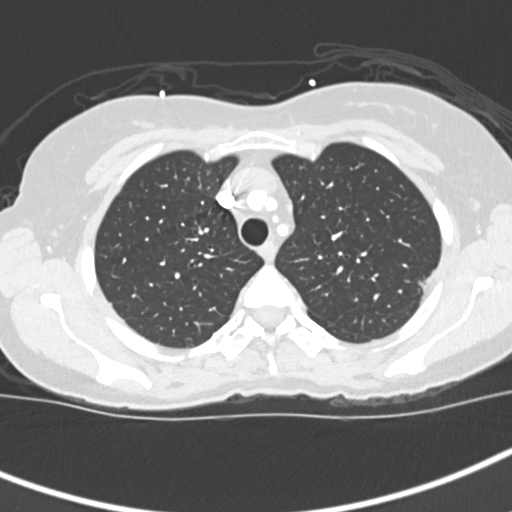
[im 243/295  soft-tissue]
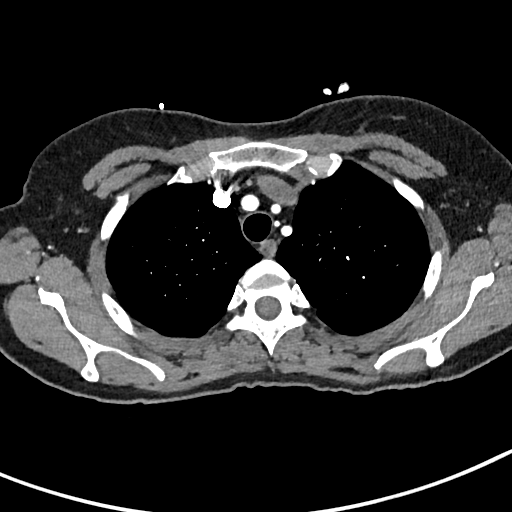
[im 256/295  lung]
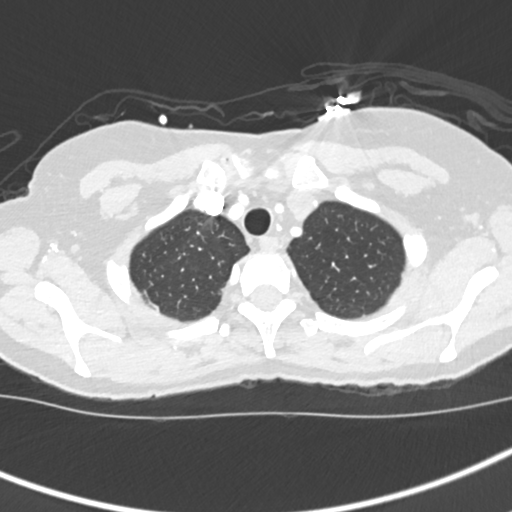
[im 282/295  soft-tissue]
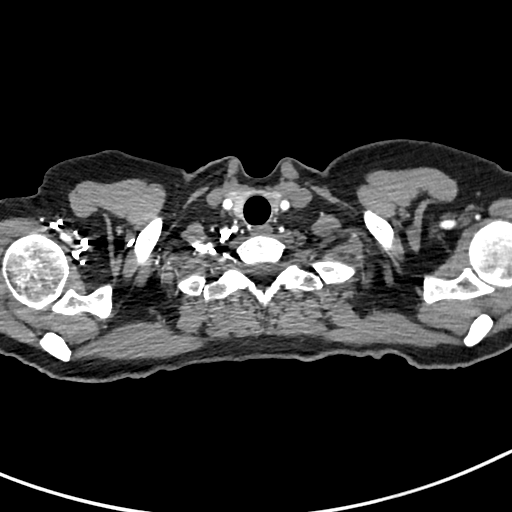

[Series 7: cor soft · coronal · 0.61mm/px · 3 of 124 slices shown]
[im 31/124  soft-tissue]
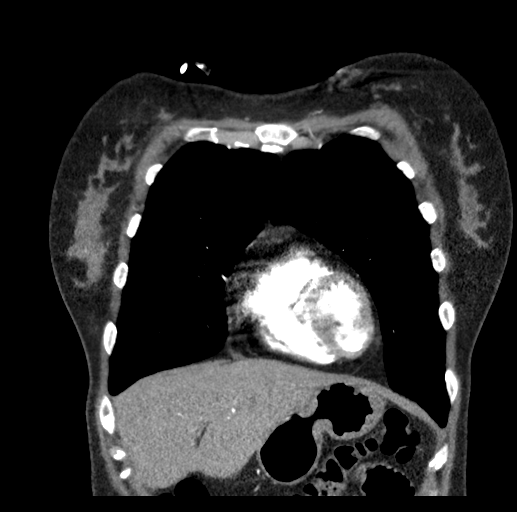
[im 62/124  soft-tissue]
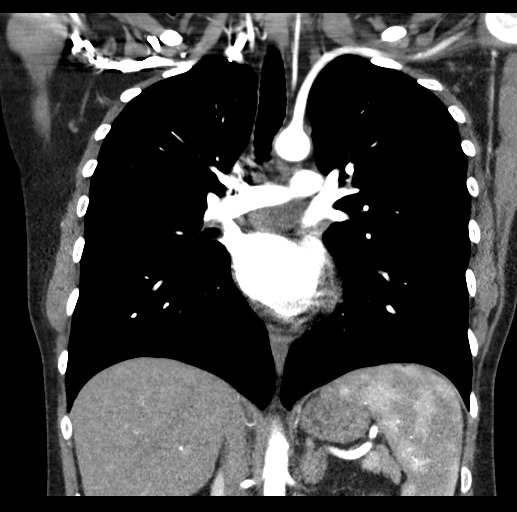
[im 93/124  soft-tissue]
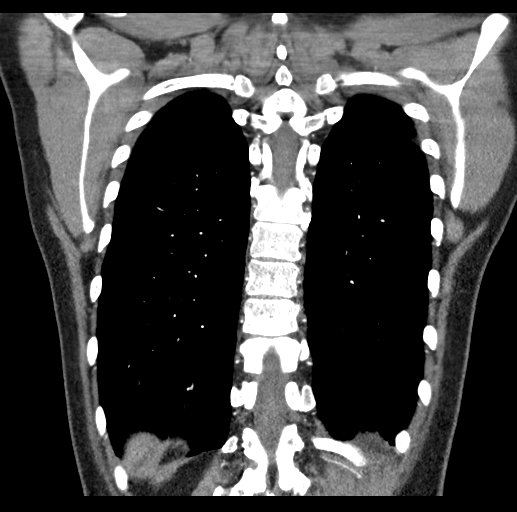

[19 of 46 positions shown; findings below may reference images not displayed]

FINDINGS: Cardiovascular: Contrast injection is sufficient to demonstrate
satisfactory opacification of the pulmonary arteries to the
segmental level. There is no pulmonary embolus. The main pulmonary
artery is within normal limits for size. There is no CT evidence of
acute right heart strain. The visualized aorta is normal. Heart size
is normal, without pericardial effusion.

Mediastinum/Nodes:

--No mediastinal or hilar lymphadenopathy.

--No axillary lymphadenopathy.

--No supraclavicular lymphadenopathy.

--Normal thyroid gland.

--The esophagus is unremarkable

Lungs/Pleura: There is a small 2 mm pulmonary nodule in the
peripheral right lower lobe (axial series 6, image 92). There is no
pneumothorax or pleural effusion. No large focal infiltrate. Bruise
scarring within the peripheral left upper lobe. The trachea is
unremarkable.

Upper Abdomen: No acute abnormality.

Musculoskeletal: No chest wall abnormality. No acute or significant
osseous findings.

Review of the MIP images confirms the above findings.
IMPRESSION: 1. No evidence of pulmonary embolism or other acute intrathoracic
process.
2. A 2 mm right lower lobe pulmonary nodule. In a low risk patient,
no further follow-up is needed. In a high-risk patient, a 12 month
follow-up CT is recommended.
